# Patient Record
Sex: Male | Born: 1995 | Hispanic: Yes | Marital: Single | Smoking: Current every day smoker
Health system: Southern US, Community
[De-identification: ages and names within clinical notes are randomized; demographics above are authoritative.]

---

## 2016-06-15 ENCOUNTER — Emergency Department: Payer: No Typology Code available for payment source

## 2016-06-15 ENCOUNTER — Encounter: Payer: Self-pay | Admitting: *Deleted

## 2016-06-15 ENCOUNTER — Emergency Department
Admission: EM | Admit: 2016-06-15 | Discharge: 2016-06-15 | Disposition: A | Discharge status: Home / Self Care | Payer: No Typology Code available for payment source | Attending: Emergency Medicine | Admitting: Emergency Medicine

## 2016-06-15 DIAGNOSIS — S0081XA Abrasion of other part of head, initial encounter: Secondary | ICD-10-CM | POA: Insufficient documentation

## 2016-06-15 DIAGNOSIS — Y999 Unspecified external cause status: Secondary | ICD-10-CM | POA: Diagnosis not present

## 2016-06-15 DIAGNOSIS — S0990XA Unspecified injury of head, initial encounter: Secondary | ICD-10-CM | POA: Diagnosis present

## 2016-06-15 DIAGNOSIS — F17210 Nicotine dependence, cigarettes, uncomplicated: Secondary | ICD-10-CM | POA: Diagnosis not present

## 2016-06-15 DIAGNOSIS — M79605 Pain in left leg: Secondary | ICD-10-CM | POA: Insufficient documentation

## 2016-06-15 DIAGNOSIS — Y92410 Unspecified street and highway as the place of occurrence of the external cause: Secondary | ICD-10-CM | POA: Diagnosis not present

## 2016-06-15 DIAGNOSIS — R0789 Other chest pain: Secondary | ICD-10-CM | POA: Insufficient documentation

## 2016-06-15 DIAGNOSIS — M79604 Pain in right leg: Secondary | ICD-10-CM | POA: Insufficient documentation

## 2016-06-15 DIAGNOSIS — Y9389 Activity, other specified: Secondary | ICD-10-CM | POA: Diagnosis not present

## 2016-06-15 DIAGNOSIS — M7918 Myalgia, other site: Secondary | ICD-10-CM

## 2016-06-15 MED ORDER — TRAMADOL HCL 50 MG PO TABS: 50.0000 mg | ORAL_TABLET | Freq: Four times a day (QID) | ORAL | 0 refills | Status: AC | PRN

## 2016-06-15 MED ORDER — HYDROMORPHONE HCL 1 MG/ML IJ SOLN
1.0000 mg | Freq: Once | INTRAMUSCULAR | Status: AC
  Administered 2016-06-15: 1 mg via INTRAMUSCULAR
  Filled 2016-06-15: qty 1

## 2016-06-15 MED ORDER — CYCLOBENZAPRINE HCL 10 MG PO TABS: 10.0000 mg | ORAL_TABLET | Freq: Three times a day (TID) | ORAL | 0 refills | Status: AC | PRN

## 2016-06-15 MED ORDER — ORPHENADRINE CITRATE 30 MG/ML IJ SOLN
60.0000 mg | Freq: Two times a day (BID) | INTRAMUSCULAR | Status: DC
  Administered 2016-06-15: 60 mg via INTRAMUSCULAR
  Filled 2016-06-15: qty 2
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
